# Patient Record
Sex: Female | Born: 1950 | Race: Black or African American | Hispanic: No | Smoking: Never smoker
Health system: Southern US, Community
[De-identification: ages and names within clinical notes are randomized; demographics above are authoritative.]

## PROBLEM LIST (undated history)

## (undated) DIAGNOSIS — G459 Transient cerebral ischemic attack, unspecified: Secondary | ICD-10-CM

## (undated) DIAGNOSIS — I10 Essential (primary) hypertension: Secondary | ICD-10-CM

---

## 2016-01-24 ENCOUNTER — Other Ambulatory Visit: Payer: Self-pay

## 2016-01-24 ENCOUNTER — Encounter: Payer: Self-pay | Admitting: Emergency Medicine

## 2016-01-24 ENCOUNTER — Emergency Department: Payer: Medicare Other

## 2016-01-24 DIAGNOSIS — R0789 Other chest pain: Secondary | ICD-10-CM | POA: Insufficient documentation

## 2016-01-24 DIAGNOSIS — I10 Essential (primary) hypertension: Secondary | ICD-10-CM | POA: Insufficient documentation

## 2016-01-24 LAB — BASIC METABOLIC PANEL
ANION GAP: 7 (ref 5–15)
BUN: 16 mg/dL (ref 6–20)
CHLORIDE: 103 mmol/L (ref 101–111)
CO2: 27 mmol/L (ref 22–32)
Calcium: 9.5 mg/dL (ref 8.9–10.3)
Creatinine, Ser: 0.8 mg/dL (ref 0.44–1.00)
GFR calc non Af Amer: >60 mL/min (ref >60)
Glucose, Bld: 96 mg/dL (ref 65–99)
Potassium: 3.6 mmol/L (ref 3.5–5.1)
Sodium: 137 mmol/L (ref 135–145)

## 2016-01-24 LAB — CBC
HEMATOCRIT: 36.9 % (ref 35.0–47.0)
Hemoglobin: 12.8 g/dL (ref 12.0–16.0)
MCH: 30.2 pg (ref 26.0–34.0)
MCHC: 34.8 g/dL (ref 32.0–36.0)
MCV: 86.7 fL (ref 80.0–100.0)
Platelets: 234 10*3/uL (ref 150–440)
RBC: 4.25 MIL/uL (ref 3.80–5.20)
RDW: 15 % — AB (ref 11.5–14.5)
WBC: 6.4 10*3/uL (ref 3.6–11.0)

## 2016-01-24 LAB — TROPONIN I: Troponin I: <0.03 ng/mL (ref <0.03)

## 2016-01-24 NOTE — ED Notes (Signed)
Patient ambulatory to triage with steady gait, without difficulty or distress noted; pt reports mid CP, nonradiating with no radiating symptoms; denies hx of same although st had similar occurrence 3 days ago

## 2016-01-25 ENCOUNTER — Emergency Department
Admission: EM | Admit: 2016-01-25 | Discharge: 2016-01-25 | Disposition: A | Discharge status: Home / Self Care | Payer: Medicare Other | Attending: Emergency Medicine | Admitting: Emergency Medicine

## 2016-01-25 DIAGNOSIS — R079 Chest pain, unspecified: Secondary | ICD-10-CM

## 2016-01-25 HISTORY — DX: Transient cerebral ischemic attack, unspecified: G45.9

## 2016-01-25 HISTORY — DX: Essential (primary) hypertension: I10

## 2016-01-25 LAB — FIBRIN DERIVATIVES D-DIMER (ARMC ONLY): FIBRIN DERIVATIVES D-DIMER (ARMC): 343 (ref 0–499)

## 2016-01-25 LAB — TROPONIN I: Troponin I: <0.03 ng/mL (ref <0.03)

## 2016-01-25 NOTE — ED Provider Notes (Signed)
[facility] Emergency Department Provider Note  ____________________________________________  Time seen: 1:15 AM  I have reviewed the triage vital signs and the nursing notes.   HISTORY  Chief Complaint Chest Pain      HPI Stacey Mcmillan is a 65 y.o. female presents with nonradiating central chest pain which occurred approximately 9 PM lasting "seconds" that is described as tightness followed by spontaneous resolution. Patient denies any accompanying symptoms no dyspnea no dizziness no diaphoresis no nausea or vomiting. Patient states that she had a similar episode 3 days ago again with the pain lasting "seconds".     Past Medical History  Diagnosis Date  . TIA (transient ischemic attack)   . Hypertension     There are no active problems to display for this patient.   Past surgical history None No current outpatient prescriptions on file.  Allergies No known drug allergies No family history on file.  Social History Social History  Substance Use Topics  . Smoking status: Never Smoker   . Smokeless tobacco: None  . Alcohol Use: No    Review of Systems  Constitutional: Negative for fever. Eyes: Negative for visual changes. ENT: Negative for sore throat. Cardiovascular: Positive for chest pain. Respiratory: Negative for shortness of breath. Gastrointestinal: Negative for abdominal pain, vomiting and diarrhea. Genitourinary: Negative for dysuria. Musculoskeletal: Negative for back pain. Skin: Negative for rash. Neurological: Negative for headaches, focal weakness or numbness.   10-point ROS otherwise negative.  ____________________________________________   PHYSICAL EXAM:  VITAL SIGNS: ED Triage Vitals  Enc Vitals Group     BP 01/24/16 2119 139/83 mmHg     Pulse Rate 01/24/16 2119 84     Resp 01/24/16 2119 18     Temp 01/24/16 2119 98.7 F (37.1 C)     Temp Source 01/24/16 2119 Oral     SpO2 01/24/16 2119 99 %   Weight 01/24/16 2119 150 lb (68.04 kg)     Height 01/24/16 2119  (1.626 m)     Head Cir --      Peak Flow --      Pain Score 01/24/16 2117 3     Pain Loc --      Pain Edu? --      Excl. in GC? --      Constitutional: Alert and oriented. Well appearing and in no distress. Eyes: Conjunctivae are normal. PERRL. Normal extraocular movements. ENT   Head: Normocephalic and atraumatic.   Nose: No congestion/rhinnorhea.   Mouth/Throat: Mucous membranes are moist.   Neck: No stridor. Hematological/Lymphatic/Immunilogical: No cervical lymphadenopathy. Cardiovascular: Normal rate, regular rhythm. Normal and symmetric distal pulses are present in all extremities. No murmurs, rubs, or gallops. Respiratory: Normal respiratory effort without tachypnea nor retractions. Breath sounds are clear and equal bilaterally. No wheezes/rales/rhonchi. Gastrointestinal: Soft and nontender. No distention. There is no CVA tenderness. Genitourinary: deferred Musculoskeletal: Nontender with normal range of motion in all extremities. No joint effusions.  No lower extremity tenderness nor edema. Neurologic:  Normal speech and language. No gross focal neurologic deficits are appreciated. Speech is normal.  Skin:  Skin is warm, dry and intact. No rash noted. Psychiatric: Mood and affect are normal. Speech and behavior are normal. Patient exhibits appropriate insight and judgment.  ____________________________________________    LABS (pertinent positives/negatives)  Labs Reviewed  CBC - Abnormal; Notable for the following:    RDW 15.0 (*)    All other components within normal limits  BASIC METABOLIC PANEL  TROPONIN I  TROPONIN I  FIBRIN DERIVATIVES D-DIMER ([facility] ONLY)     ____________________________________________   EKG  ED ECG REPORT I, [place] N BROWN, the attending physician, personally viewed and interpreted this ECG.   Date: 01/25/2016  EKG Time: 9:13 PM  Rate: 84  Rhythm:  Normal Sinus rhythm  Axis: Normal  Intervals: Normal  ST&T Change: None   ____________________________________________    RADIOLOGY  DG Chest 2 View (Final result) Result time: 01/24/16 21:47:19   Final result by Rad Results In Interface (01/24/16 21:47:19)   Narrative:   CLINICAL DATA: Chest pain  EXAM: CHEST 2 VIEW  COMPARISON: None.  FINDINGS: Normal heart size and mediastinal contours. No acute infiltrate or edema. No effusion or pneumothorax. No acute osseous findings.  IMPRESSION: No active cardiopulmonary disease.   Electronically Signed By: Marnee SpringJonathon Watts M.D. On: 01/24/2016 21:47        Procedures    INITIAL IMPRESSION / ASSESSMENT AND PLAN / ED COURSE  Pertinent labs & imaging results that were available during my care of the patient were reviewed by me and considered in my medical decision making (see chart for details).  No clear etiology of patient's chest pain. Consider possible cardiac etiology however troponin negative 2 EKG unremarkable refer the patient to Dr. Darrold JunkerParaschos cardiologist for outpatient evaluation and management  ____________________________________________   FINAL CLINICAL IMPRESSION(S) / ED DIAGNOSES  Final diagnoses:  Chest pain, unspecified chest pain type      Darci Currentandolph N Brown, MD 01/25/16 [id number]

## 2016-01-25 NOTE — Discharge Instructions (Signed)
Nonspecific Chest Pain  °Chest pain can be caused by many different conditions. There is always a chance that your pain could be related to something serious, such as a heart attack or a blood clot in your lungs. Chest pain can also be caused by conditions that are not life-threatening. If you have chest pain, it is very important to follow up with your health care provider. °CAUSES  °Chest pain can be caused by: °· Heartburn. °· Pneumonia or bronchitis. °· Anxiety or stress. °· Inflammation around your heart (pericarditis) or lung (pleuritis or pleurisy). °· A blood clot in your lung. °· A collapsed lung (pneumothorax). It can develop suddenly on its own (spontaneous pneumothorax) or from trauma to the chest. °· Shingles infection (varicella-zoster virus). °· Heart attack. °· Damage to the bones, muscles, and cartilage that make up your chest wall. This can include: °¨ Bruised bones due to injury. °¨ Strained muscles or cartilage due to frequent or repeated coughing or overwork. °¨ Fracture to one or more ribs. °¨ Sore cartilage due to inflammation (costochondritis). °RISK FACTORS  °Risk factors for chest pain may include: °· Activities that increase your risk for trauma or injury to your chest. °· Respiratory infections or conditions that cause frequent coughing. °· Medical conditions or overeating that can cause heartburn. °· Heart disease or family history of heart disease. °· Conditions or health behaviors that increase your risk of developing a blood clot. °· Having had chicken pox (varicella zoster). °SIGNS AND SYMPTOMS °Chest pain can feel like: °· Burning or tingling on the surface of your chest or deep in your chest. °· Crushing, pressure, aching, or squeezing pain. °· Dull or sharp pain that is worse when you move, cough, or take a deep breath. °· Pain that is also felt in your back, neck, shoulder, or arm, or pain that spreads to any of these areas. °Your chest pain may come and go, or it may stay  constant. °DIAGNOSIS °Lab tests or other studies may be needed to find the cause of your pain. Your health care provider may have you take a test called an ambulatory ECG (electrocardiogram). An ECG records your heartbeat patterns at the time the test is performed. You may also have other tests, such as: °· Transthoracic echocardiogram (TTE). During echocardiography, sound waves are used to create a picture of all of the heart structures and to look at how blood flows through your heart. °· Transesophageal echocardiogram (TEE). This is a more advanced imaging test that obtains images from inside your body. It allows your health care provider to see your heart in finer detail. °· Cardiac monitoring. This allows your health care provider to monitor your heart rate and rhythm in real time. °· Holter monitor. This is a portable device that records your heartbeat and can help to diagnose abnormal heartbeats. It allows your health care provider to track your heart activity for several days, if needed. °· Stress tests. These can be done through exercise or by taking medicine that makes your heart beat more quickly. °· Blood tests. °· Imaging tests. °TREATMENT  °Your treatment depends on what is causing your chest pain. Treatment may include: °· Medicines. These may include: °¨ Acid blockers for heartburn. °¨ Anti-inflammatory medicine. °¨ Pain medicine for inflammatory conditions. °¨ Antibiotic medicine, if an infection is present. °¨ Medicines to dissolve blood clots. °¨ Medicines to treat coronary artery disease. °· Supportive care for conditions that do not require medicines. This may include: °¨ Resting. °¨ Applying heat   or cold packs to injured areas. °¨ Limiting activities until pain decreases. °HOME CARE INSTRUCTIONS °· If you were prescribed an antibiotic medicine, finish it all even if you start to feel better. °· Avoid any activities that bring on chest pain. °· Do not use any tobacco products, including  cigarettes, chewing tobacco, or electronic cigarettes. If you need help quitting, ask your health care provider. °· Do not drink alcohol. °· Take medicines only as directed by your health care provider. °· Keep all follow-up visits as directed by your health care provider. This is important. This includes any further testing if your chest pain does not go away. °· If heartburn is the cause for your chest pain, you may be told to keep your head raised (elevated) while sleeping. This reduces the chance that acid will go from your stomach into your esophagus. °· Make lifestyle changes as directed by your health care provider. These may include: °¨ Getting regular exercise. Ask your health care provider to suggest some activities that are safe for you. °¨ Eating a heart-healthy diet. A registered dietitian can help you to learn healthy eating options. °¨ Maintaining a healthy weight. °¨ Managing diabetes, if necessary. °¨ Reducing stress. °SEEK MEDICAL CARE IF: °· Your chest pain does not go away after treatment. °· You have a rash with blisters on your chest. °· You have a fever. °SEEK IMMEDIATE MEDICAL CARE IF:  °· Your chest pain is worse. °· You have an increasing cough, or you cough up blood. °· You have severe abdominal pain. °· You have severe weakness. °· You faint. °· You have chills. °· You have sudden, unexplained chest discomfort. °· You have sudden, unexplained discomfort in your arms, back, neck, or jaw. °· You have shortness of breath at any time. °· You suddenly start to sweat, or your skin gets clammy. °· You feel nauseous or you vomit. °· You suddenly feel light-headed or dizzy. °· Your heart begins to beat quickly, or it feels like it is skipping beats. °These symptoms may represent a serious problem that is an emergency. Do not wait to see if the symptoms will go away. Get medical help right away. Call your local emergency services (911 in the [place] Do not drive yourself to the hospital. °  °This  information is not intended to replace advice given to you by your health care provider. Make sure you discuss any questions you have with your health care provider. °  °Document Released: 04/02/2005 Document Revised: 07/14/2014 Document Reviewed: 01/27/2014 °Elsevier Interactive Patient Education ©2016 [employer] ° °

## 2016-01-25 NOTE — ED Notes (Signed)
MD Brown at bedside.

## 2016-01-25 NOTE — ED Notes (Signed)
Pt reports she had an episode of central chest pan at 2130 on 7/20 that lasted less than a minute. Pt described the pain as pressure. Pt denies current pain. Pt denies of SOB, weakness, lightheadedness/dizziness, radiation, nausea/vomiting.

## 2016-01-25 NOTE — ED Notes (Signed)
Reviewed d/c instructions, follow-up care with pt. Pt verbalized understanding 

## 2016-07-22 ENCOUNTER — Other Ambulatory Visit: Payer: Self-pay | Admitting: Obstetrics and Gynecology

## 2016-07-22 DIAGNOSIS — Z1231 Encounter for screening mammogram for malignant neoplasm of breast: Secondary | ICD-10-CM

## 2016-08-11 ENCOUNTER — Ambulatory Visit
Admission: EM | Admit: 2016-08-11 | Discharge: 2016-08-11 | Disposition: A | Discharge status: Home / Self Care | Payer: Medicare Other | Attending: Family Medicine | Admitting: Family Medicine

## 2016-08-11 DIAGNOSIS — F418 Other specified anxiety disorders: Secondary | ICD-10-CM | POA: Diagnosis not present

## 2016-08-11 DIAGNOSIS — G44209 Tension-type headache, unspecified, not intractable: Secondary | ICD-10-CM | POA: Diagnosis not present

## 2016-08-11 MED ORDER — HYDROXYZINE HCL 25 MG PO TABS: 25.0000 mg | ORAL_TABLET | Freq: Three times a day (TID) | ORAL | 0 refills | Status: DC | PRN

## 2016-08-11 NOTE — ED Provider Notes (Signed)
[facility]    CSN: [id number] Arrival date & time: 08/11/16  1309     History   Chief Complaint Chief Complaint  Patient presents with  . Headache    HPI Stacey Mcmillan is a 66 y.o. female.   66 yo female with a c/o chronic, intermittent headaches since last July. Denies any injuries, trauma, fevers, chills, vision changes,  States is experiencing a lot of stress and doesn't get good sleep.     Headache  Pain location:  Generalized Quality:  Dull   Past Medical History:  Diagnosis Date  . Hypertension   . TIA (transient ischemic attack)     There are no active problems to display for this patient.   History reviewed. No pertinent surgical history.  OB History    No data available       Home Medications    Prior to Admission medications   Medication Sig Start Date End Date Taking? Authorizing Provider  amLODipine (NORVASC) 5 MG tablet Take 5 mg by mouth daily.   Yes Historical Provider, MD  aspirin 81 MG chewable tablet Chew by mouth daily.   Yes Historical Provider, MD  hydrOXYzine (ATARAX/VISTARIL) 25 MG tablet Take 1 tablet (25 mg total) by mouth every 8 (eight) hours as needed. 08/11/16   Payton Mccallum, MD    Family History History reviewed. No pertinent family history.  Social History Social History  Substance Use Topics  . Smoking status: Never Smoker  . Smokeless tobacco: Never Used  . Alcohol use No     Allergies   Patient has no known allergies.   Review of Systems Review of Systems  Neurological: Positive for headaches.     Physical Exam Triage Vital Signs ED Triage Vitals  Enc Vitals Group     BP 08/11/16 1336 135/85     Pulse Rate 08/11/16 1336 82     Resp 08/11/16 1336 18     Temp 08/11/16 1336 98.2 F (36.8 C)     Temp Source 08/11/16 1336 Oral     SpO2 08/11/16 1336 100 %     Weight 08/11/16 1336 165 lb (74.8 kg)     Height 08/11/16 1336 5\' 4"  (1.626 m)     Head Circumference --      Peak Flow --      Pain Score 08/11/16 1337 7     Pain Loc --      Pain Edu? --      Excl. in GC? --    No data found.   Updated Vital Signs BP 135/85 (BP Location: Left Arm)   Pulse 82   Temp 98.2 F (36.8 C) (Oral)   Resp 18   Ht 5\' 4"  (1.626 m)   Wt 165 lb (74.8 kg)   SpO2 100%   BMI 28.32 kg/m   Visual Acuity Right Eye Distance:   Left Eye Distance:   Bilateral Distance:    Right Eye Near:   Left Eye Near:    Bilateral Near:     Physical Exam  Constitutional: She is oriented to person, place, and time. She appears well-developed and well-nourished. No distress.  HENT:  Head: Normocephalic and atraumatic.  Right Ear: Tympanic membrane, external ear and ear canal normal.  Left Ear: Tympanic membrane, external ear and ear canal normal.  Nose: No mucosal edema, rhinorrhea, nose lacerations, sinus tenderness, nasal deformity, septal deviation or nasal septal hematoma. No epistaxis.  No foreign bodies. Right sinus exhibits no maxillary  sinus tenderness and no frontal sinus tenderness. Left sinus exhibits no maxillary sinus tenderness and no frontal sinus tenderness.  Mouth/Throat: Uvula is midline, oropharynx is clear and moist and mucous membranes are normal. No oropharyngeal exudate.  Eyes: Conjunctivae and EOM are normal. Pupils are equal, round, and reactive to light. Right eye exhibits no discharge. Left eye exhibits no discharge. No scleral icterus.  Neck: Normal range of motion. Neck supple. No thyromegaly present.  Cardiovascular: Normal rate, regular rhythm and normal heart sounds.   Pulmonary/Chest: Effort normal and breath sounds normal. No respiratory distress. She has no wheezes. She has no rales.  Lymphadenopathy:    She has no cervical adenopathy.  Neurological: She is alert and oriented to person, place, and time. She displays normal reflexes. No cranial nerve deficit or sensory deficit. She exhibits normal muscle tone. Coordination normal.  Skin: She is not diaphoretic.    Nursing note and vitals reviewed.    UC Treatments / Results  Labs (all labs ordered are listed, but only abnormal results are displayed) Labs Reviewed - No data to display  EKG  EKG Interpretation None       Radiology No results found.  Procedures Procedures (including critical care time)  Medications Ordered in UC Medications - No data to display   Initial Impression / Assessment and Plan / UC Course  I have reviewed the triage vital signs and the nursing notes.  Pertinent labs & imaging results that were available during my care of the patient were reviewed by me and considered in my medical decision making (see chart for details).       Final Clinical Impressions(s) / UC Diagnoses   Final diagnoses:  Tension headache  Situational anxiety    New Prescriptions Discharge Medication List as of 08/11/2016  2:48 PM    START taking these medications   Details  hydrOXYzine (ATARAX/VISTARIL) 25 MG tablet Take 1 tablet (25 mg total) by mouth every 8 (eight) hours as needed., Starting Mon 08/11/2016, Normal       1. diagnosis reviewed with patient 2. rx as per orders above; reviewed possible side effects, interactions, risks and benefits  3. Recommend supportive treatment with otc analgesics prn 4. Follow-up prn if symptoms worsen or don't improve   Payton Mccallumrlando Loui [place], MD 08/11/16 1531

## 2016-08-11 NOTE — ED Triage Notes (Signed)
Pt c/o headache that comes and goes. She had blood work in July and everything looked ok however she is just scared something is wrong. Denies vision changes, does have increased urination. Feels like she is breaking out with a rash on her face.

## 2016-08-25 ENCOUNTER — Ambulatory Visit: Payer: Medicare Other | Attending: Obstetrics and Gynecology

## 2016-12-11 ENCOUNTER — Inpatient Hospital Stay: Admission: RE | Admit: 2016-12-11 | Payer: Medicare Other | Source: Ambulatory Visit

## 2017-02-10 ENCOUNTER — Encounter: Payer: Self-pay | Admitting: Urology

## 2017-02-10 ENCOUNTER — Ambulatory Visit: Payer: Self-pay | Admitting: Urology

## 2017-04-02 ENCOUNTER — Other Ambulatory Visit: Payer: Self-pay | Admitting: Nurse Practitioner

## 2017-04-02 DIAGNOSIS — G44099 Other trigeminal autonomic cephalgias (TAC), not intractable: Secondary | ICD-10-CM

## 2017-04-08 ENCOUNTER — Ambulatory Visit: Payer: Medicare Other

## 2017-04-24 ENCOUNTER — Ambulatory Visit: Admission: RE | Admit: 2017-04-24 | Payer: Medicare Other | Source: Ambulatory Visit

## 2017-05-22 ENCOUNTER — Ambulatory Visit: Admission: RE | Admit: 2017-05-22 | Payer: Medicare Other | Source: Ambulatory Visit

## 2017-08-11 ENCOUNTER — Other Ambulatory Visit: Payer: Self-pay | Admitting: Nurse Practitioner

## 2017-08-11 DIAGNOSIS — G44099 Other trigeminal autonomic cephalgias (TAC), not intractable: Secondary | ICD-10-CM

## 2017-08-12 ENCOUNTER — Other Ambulatory Visit: Payer: Self-pay | Admitting: Nurse Practitioner

## 2017-08-12 DIAGNOSIS — G44099 Other trigeminal autonomic cephalgias (TAC), not intractable: Secondary | ICD-10-CM

## 2017-08-13 ENCOUNTER — Ambulatory Visit: Payer: Medicare Other

## 2017-08-13 ENCOUNTER — Ambulatory Visit: Admission: RE | Admit: 2017-08-13 | Payer: Medicare Other | Source: Ambulatory Visit

## 2017-08-20 ENCOUNTER — Ambulatory Visit: Admission: RE | Admit: 2017-08-20 | Payer: Medicare Other | Source: Ambulatory Visit

## 2017-09-19 ENCOUNTER — Ambulatory Visit: Admission: RE | Admit: 2017-09-19 | Payer: Medicare Other | Source: Ambulatory Visit

## 2017-12-17 ENCOUNTER — Other Ambulatory Visit: Payer: Self-pay | Admitting: Nurse Practitioner

## 2017-12-17 DIAGNOSIS — G44099 Other trigeminal autonomic cephalgias (TAC), not intractable: Secondary | ICD-10-CM

## 2017-12-23 ENCOUNTER — Ambulatory Visit
Admission: RE | Admit: 2017-12-23 | Discharge: 2017-12-23 | Disposition: A | Discharge status: Home / Self Care | Payer: Medicare Other | Source: Ambulatory Visit | Attending: Nurse Practitioner | Admitting: Nurse Practitioner

## 2017-12-23 DIAGNOSIS — G44099 Other trigeminal autonomic cephalgias (TAC), not intractable: Secondary | ICD-10-CM | POA: Diagnosis present

## 2017-12-23 DIAGNOSIS — R51 Headache: Secondary | ICD-10-CM | POA: Insufficient documentation

## 2018-12-14 ENCOUNTER — Other Ambulatory Visit: Payer: Self-pay

## 2018-12-14 ENCOUNTER — Encounter: Payer: Self-pay | Admitting: Emergency Medicine

## 2018-12-14 ENCOUNTER — Emergency Department
Admission: EM | Admit: 2018-12-14 | Discharge: 2018-12-14 | Disposition: A | Discharge status: Home / Self Care | Payer: Medicare Other | Attending: Emergency Medicine | Admitting: Emergency Medicine

## 2018-12-14 DIAGNOSIS — R531 Weakness: Secondary | ICD-10-CM

## 2018-12-14 DIAGNOSIS — Z7982 Long term (current) use of aspirin: Secondary | ICD-10-CM | POA: Insufficient documentation

## 2018-12-14 DIAGNOSIS — Z79899 Other long term (current) drug therapy: Secondary | ICD-10-CM | POA: Insufficient documentation

## 2018-12-14 DIAGNOSIS — R112 Nausea with vomiting, unspecified: Secondary | ICD-10-CM | POA: Insufficient documentation

## 2018-12-14 DIAGNOSIS — Z8673 Personal history of transient ischemic attack (TIA), and cerebral infarction without residual deficits: Secondary | ICD-10-CM | POA: Insufficient documentation

## 2018-12-14 DIAGNOSIS — I10 Essential (primary) hypertension: Secondary | ICD-10-CM | POA: Diagnosis not present

## 2018-12-14 DIAGNOSIS — E876 Hypokalemia: Secondary | ICD-10-CM | POA: Diagnosis not present

## 2018-12-14 LAB — URINE DRUG SCREEN, QUALITATIVE (ARMC ONLY)
Amphetamines, Ur Screen: NOT DETECTED
Barbiturates, Ur Screen: NOT DETECTED
Benzodiazepine, Ur Scrn: NOT DETECTED
Cannabinoid 50 Ng, Ur ~~LOC~~: NOT DETECTED
Cocaine Metabolite,Ur ~~LOC~~: NOT DETECTED
MDMA (Ecstasy)Ur Screen: NOT DETECTED
Methadone Scn, Ur: NOT DETECTED
Opiate, Ur Screen: NOT DETECTED
Phencyclidine (PCP) Ur S: NOT DETECTED
Tricyclic, Ur Screen: NOT DETECTED

## 2018-12-14 LAB — CBC WITH DIFFERENTIAL/PLATELET
Abs Immature Granulocytes: 0.05 10*3/uL (ref 0.00–0.07)
Basophils Absolute: 0 10*3/uL (ref 0.0–0.1)
Basophils Relative: 1 %
Eosinophils Absolute: 0.2 10*3/uL (ref 0.0–0.5)
Eosinophils Relative: 3 %
HCT: 37.1 % (ref 36.0–46.0)
Hemoglobin: 12.5 g/dL (ref 12.0–15.0)
Immature Granulocytes: 1 %
Lymphocytes Relative: 38 %
Lymphs Abs: 3.2 10*3/uL (ref 0.7–4.0)
MCH: 29.1 pg (ref 26.0–34.0)
MCHC: 33.7 g/dL (ref 30.0–36.0)
MCV: 86.5 fL (ref 80.0–100.0)
Monocytes Absolute: 0.5 10*3/uL (ref 0.1–1.0)
Monocytes Relative: 6 %
Neutro Abs: 4.3 10*3/uL (ref 1.7–7.7)
Neutrophils Relative %: 51 %
Platelets: 287 10*3/uL (ref 150–400)
RBC: 4.29 MIL/uL (ref 3.87–5.11)
RDW: 14.6 % (ref 11.5–15.5)
WBC: 8.3 10*3/uL (ref 4.0–10.5)
nRBC: 0 % (ref 0.0–0.2)

## 2018-12-14 LAB — URINALYSIS, COMPLETE (UACMP) WITH MICROSCOPIC
Bilirubin Urine: NEGATIVE
Glucose, UA: NEGATIVE mg/dL
Hgb urine dipstick: NEGATIVE
Ketones, ur: NEGATIVE mg/dL
Leukocytes,Ua: NEGATIVE
Nitrite: NEGATIVE
Protein, ur: 30 mg/dL — AB
Specific Gravity, Urine: 1.018 (ref 1.005–1.030)
pH: 6 (ref 5.0–8.0)

## 2018-12-14 LAB — BASIC METABOLIC PANEL
Anion gap: 8 (ref 5–15)
BUN: 12 mg/dL (ref 8–23)
CO2: 26 mmol/L (ref 22–32)
Calcium: 9 mg/dL (ref 8.9–10.3)
Chloride: 106 mmol/L (ref 98–111)
Creatinine, Ser: 0.72 mg/dL (ref 0.44–1.00)
GFR calc Af Amer: >60 mL/min (ref >60)
GFR calc non Af Amer: >60 mL/min (ref >60)
Glucose, Bld: 162 mg/dL — ABNORMAL HIGH (ref 70–99)
Potassium: 2.5 mmol/L — CL (ref 3.5–5.1)
Sodium: 140 mmol/L (ref 135–145)

## 2018-12-14 LAB — LIPASE, BLOOD: Lipase: 32 U/L (ref 11–51)

## 2018-12-14 LAB — HEPATIC FUNCTION PANEL
ALT: 16 U/L (ref 0–44)
AST: 20 U/L (ref 15–41)
Albumin: 4 g/dL (ref 3.5–5.0)
Alkaline Phosphatase: 62 U/L (ref 38–126)
Bilirubin, Direct: <0.1 mg/dL (ref 0.0–0.2)
Total Bilirubin: 0.5 mg/dL (ref 0.3–1.2)
Total Protein: 7.9 g/dL (ref 6.5–8.1)

## 2018-12-14 LAB — TROPONIN I: Troponin I: <0.03 ng/mL (ref <0.03)

## 2018-12-14 MED ORDER — ONDANSETRON HCL 4 MG/2ML IJ SOLN
4.0000 mg | Freq: Once | INTRAMUSCULAR | Status: DC
  Filled 2018-12-14: qty 2

## 2018-12-14 MED ORDER — POTASSIUM CHLORIDE ER 20 MEQ PO TBCR: 20.0000 meq | EXTENDED_RELEASE_TABLET | Freq: Two times a day (BID) | ORAL | 0 refills | Status: AC

## 2018-12-14 MED ORDER — SODIUM CHLORIDE 0.9 % IV BOLUS
1000.0000 mL | Freq: Once | INTRAVENOUS | Status: AC
  Administered 2018-12-14: 1000 mL via INTRAVENOUS

## 2018-12-14 MED ORDER — POTASSIUM CHLORIDE 10 MEQ/100ML IV SOLN
10.0000 meq | Freq: Once | INTRAVENOUS | Status: AC
  Administered 2018-12-14: 10 meq via INTRAVENOUS
  Filled 2018-12-14: qty 100

## 2018-12-14 NOTE — ED Triage Notes (Signed)
Pt ems from car at [facility] for Nausea and weakness. Pt states that symptoms started 0700 today. Pt sleepy, slow speech, oriented x 4. Pts cloths are wet stating that she poured water on herself to cool down. cbg 140 for ems

## 2018-12-14 NOTE — Discharge Instructions (Signed)
Take the potassium as prescribed for the next week.  Make an appointment to follow-up with your doctor in 1 to 2 weeks to have the potassium level rechecked and for general follow-up.  Return to the ER for new, worsening, or persistent vomiting, weakness, or any other new or worsening symptoms that concern you.

## 2018-12-14 NOTE — ED Provider Notes (Signed)
[facility] Emergency Department Provider Note ____________________________________________   First MD Initiated Contact with Patient 12/14/18 0801     (approximate)  I have reviewed the triage vital signs and the nursing notes.   HISTORY  Chief Complaint Weakness    HPI Stacey Mcmillan is a 68 y.o. female with PMH as noted below who presents with generalized weakness, acute onset, associated with nausea and vomiting and feeling very hot, and now the patient reports feeling fatigued and tired.  The patient states that she was dropping off her husband at dialysis when she started to feel sick and vomited.  Per EMS, the patient was sleepy and they were concerned because she looked diaphoretic, however it turned out that she had poured water on herself to cool down.  The patient states that she felt fine yesterday and when she awoke this morning.  She denies any abdominal or chest pain, headache, fever, diarrhea, or urinary symptoms.  She has had no new medications.  Past Medical History:  Diagnosis Date  . Hypertension   . TIA (transient ischemic attack)     There are no active problems to display for this patient.   History reviewed. No pertinent surgical history.  Prior to Admission medications   Medication Sig Start Date End Date Taking? Authorizing Provider  amLODipine (NORVASC) 2.5 MG tablet Take 2.5 mg by mouth daily.    Yes [provider]  aspirin 81 MG chewable tablet Chew by mouth daily.   Yes [provider]  Vitamin D, Ergocalciferol, (DRISDOL) 1.25 MG (50000 UT) CAPS capsule Take 50,000 Units by mouth once a week. 12/13/18 01/12/19 Yes [provider]  potassium chloride 20 MEQ TBCR Take 20 mEq by mouth 2 (two) times daily for 7 days. 12/14/18 12/21/18  Dionne BucySiadecki, Keymoni Mccaster, MD    Allergies Penicillins; Shellfish allergy; and Orange oil  History reviewed. No pertinent family history.  Social History Social History    Tobacco Use  . Smoking status: Never Smoker  . Smokeless tobacco: Never Used  Substance Use Topics  . Alcohol use: No  . Drug use: No    Review of Systems  Constitutional: No fever.  Positive for weakness. Eyes: No redness. ENT: No sore throat. Cardiovascular: Denies chest pain. Respiratory: Denies shortness of breath. Gastrointestinal: Positive for nausea and vomiting. Genitourinary: Negative for dysuria.  Musculoskeletal: Negative for back pain. Skin: Negative for rash. Neurological: Negative for headaches, focal weakness or numbness.   ____________________________________________   PHYSICAL EXAM:  VITAL SIGNS: ED Triage Vitals  Enc Vitals Group     BP 12/14/18 0815 130/77     Pulse Rate 12/14/18 0815 71     Resp --      Temp 12/14/18 0815 (!) 97.5 F (36.4 C)     Temp Source 12/14/18 0815 Oral     SpO2 12/14/18 0815 97 %     Weight 12/14/18 0816 165 lb (74.8 kg)     Height 12/14/18 0816 5\' 4"  (1.626 m)     Head Circumference --      Peak Flow --      Pain Score 12/14/18 0816 0     Pain Loc --      Pain Edu? --      Excl. in GC? --     Constitutional: Alert and oriented.  Tired appearing but in no acute distress. Eyes: Conjunctivae are normal.  EOMI.  PERRLA.  No scleral icterus. Head: Atraumatic. Nose: No congestion/rhinnorhea. Mouth/Throat: Mucous membranes  are dry.   Neck: Normal range of motion.  Cardiovascular: Normal rate, regular rhythm. Good peripheral circulation. Respiratory: Normal respiratory effort.  No retractions.  Gastrointestinal: Soft and nontender. No distention.  Genitourinary: No flank tenderness. Musculoskeletal: No lower extremity edema.  Extremities warm and well perfused.  Neurologic:  Normal speech and language.  Motor intact in all extremities.  Normal coordination.  No gross focal neurologic deficits are appreciated.  Skin:  Skin is warm and dry. No rash noted. Psychiatric: Mood and affect are normal. Speech and behavior are  normal.  ____________________________________________   LABS (all labs ordered are listed, but only abnormal results are displayed)  Labs Reviewed  BASIC METABOLIC PANEL - Abnormal; Notable for the following components:      Result Value   Potassium 2.5 (*)    Glucose, Bld 162 (*)    All other components within normal limits  URINALYSIS, COMPLETE (UACMP) WITH MICROSCOPIC - Abnormal; Notable for the following components:   Color, Urine YELLOW (*)    APPearance HAZY (*)    Protein, ur 30 (*)    Bacteria, UA RARE (*)    All other components within normal limits  HEPATIC FUNCTION PANEL  LIPASE, BLOOD  TROPONIN I  CBC WITH DIFFERENTIAL/PLATELET  URINE DRUG SCREEN, QUALITATIVE ([facility] ONLY)   ____________________________________________  EKG  ED ECG REPORT I, Arta Silence, the attending physician, personally viewed and interpreted this ECG.  Date: 12/14/2018 EKG Time: 0810 Rate: 70 Rhythm: normal sinus rhythm QRS Axis: normal Intervals: normal ST/T Wave abnormalities: Nonspecific T wave abnormalities laterally Narrative Interpretation: no evidence of acute ischemia; similar appearance to EKG from 01/24/2016  ____________________________________________  RADIOLOGY    ____________________________________________   PROCEDURES  Procedure(s) performed: No  Procedures  Critical Care performed: No ____________________________________________   INITIAL IMPRESSION / ASSESSMENT AND PLAN / ED COURSE  Pertinent labs & imaging results that were available during my care of the patient were reviewed by me and considered in my medical decision making (see chart for details).  68 year old female with PMH of hypertension and TIA presents with acute onset of nausea and vomiting as well as generalized weakness and malaise this morning after dropping off her husband at dialysis.  The patient states she was in her usual state of health yesterday and this morning.  I reviewed  the past medical records in [place].  The patient has had no recent prior ED visits or admissions, and actually was seen by her PMD Dr. Ginette Pitman for a routine visit yesterday with no acute symptoms.  On exam, the patient is tired but relatively well-appearing.  Her vital signs are normal.  EMS reported that the patient was acting somewhat strange, and although she is in the bed with her eyes closed and states she feels weak, she is fully alert and oriented.  She is answering all questions appropriately and can fully participate in exam.  She denies any acute pain.  The abdomen is soft and nontender.  Neurologic exam is nonfocal.  Overall I suspect most likely gastroenteritis, gastritis, gastroparesis, or viral syndrome, with likely vasovagal near syncope resulting in the more generalized symptoms.  Given the benign abdominal exam there is no evidence of acute intra-abdominal process.  The patient has no neurologic deficits, headache, or other symptoms to suggest CNS etiology.  I have a low suspicion for cardiac cause and there are no EKG changes.  We will give fluids and Zofran, obtain labs, and reassess.  ----------------------------------------- 1:19 PM on 12/14/2018 -----------------------------------------  The  lab work-up is unremarkable except for hypokalemia.  I do not think that this is all acute.  I suspect that the patient had some chronic hypokalemia and then it may have worsened with the vomiting today.  She has no EKG abnormalities.  We repleted with 2 rounds of IV potassium and also gave a liter of normal saline.  The patient is feeling better and appears comfortable.  She has had no vomiting and no further symptoms in the ED.  She would like to go home.  I counseled her on the results of the work-up.  I will prescribe a week's supply of oral potassium supplementation and instructed her to follow-up with her PMD.  Return precautions given, and she expresses understanding.  ____________________________________________   FINAL CLINICAL IMPRESSION(S) / ED DIAGNOSES  Final diagnoses:  Hypokalemia  Generalized weakness  Non-intractable vomiting with nausea, unspecified vomiting type      NEW MEDICATIONS STARTED DURING THIS VISIT:  New Prescriptions   POTASSIUM CHLORIDE 20 MEQ TBCR    Take 20 mEq by mouth 2 (two) times daily for 7 days.     Note:  This document was prepared using Dragon voice recognition software and may include unintentional dictation errors.    Dionne BucySiadecki, Edra Riccardi, MD 12/14/18 1320

## 2019-05-11 ENCOUNTER — Other Ambulatory Visit: Payer: Self-pay | Admitting: Internal Medicine

## 2019-05-11 DIAGNOSIS — Z1231 Encounter for screening mammogram for malignant neoplasm of breast: Secondary | ICD-10-CM

## 2019-12-30 IMAGING — MR MR HEAD W/O CM
11 series · 48 of 48 positions shown · non-contrast
Comparison: None.

CLINICAL DATA: Trigeminal autonomic cephalgias. Intermittent
headaches for 1 year.

EXAM:
MRI HEAD WITHOUT CONTRAST
TECHNIQUE: Multiplanar, multiecho pulse sequences of the brain and surrounding
structures were obtained without intravenous contrast.

[Series 2: T1 · sagittal · 5.0mm · 0.45mm/px · 3 of 27 slices shown (1 of 2)]
[im 1/27]
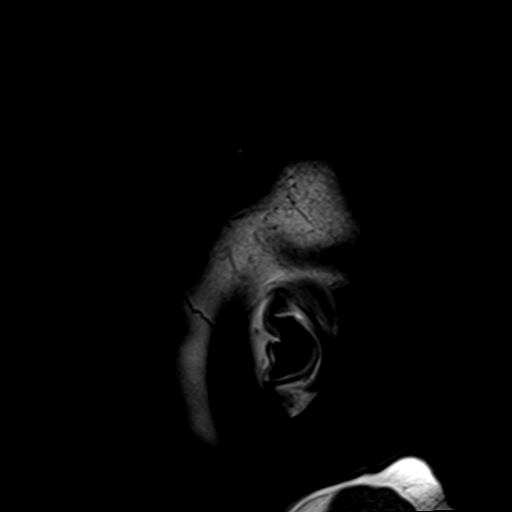
[im 14/27]
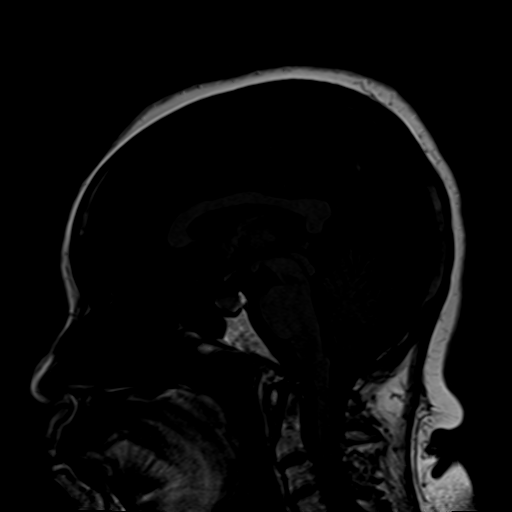
[im 27/27]
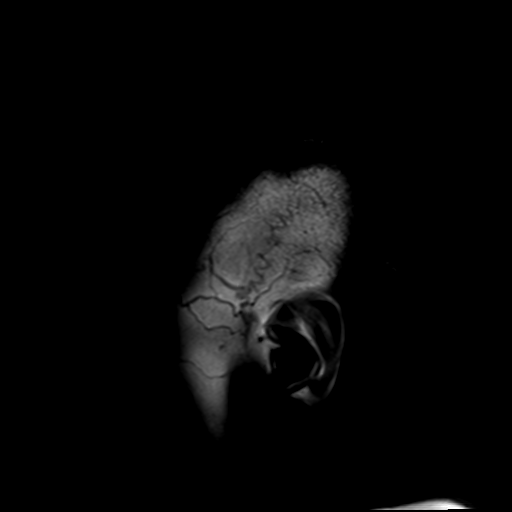

[Series 4: DWI · axial · 3.0mm · 1.80mm/px · z∈[-10,+143]mm · 5 of 52 slices shown (1 of 2)]
[im 1/52]
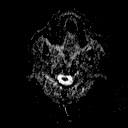
[im 13/52]
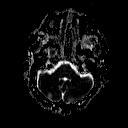
[im 26/52]
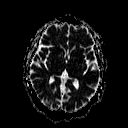
[im 39/52]
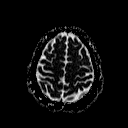
[im 52/52]
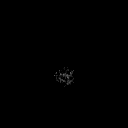

[Series 6: DWI · coronal · 3.0mm · 1.80mm/px · 4 of 47 slices shown (2 of 2)]
[im 1/47]
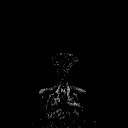
[im 16/47]
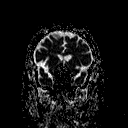
[im 31/47]
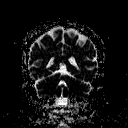
[im 47/47]
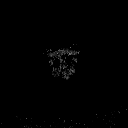

[Series 7: T2 · axial · 5.0mm · 0.60mm/px · z∈[-16,+140]mm · 2 of 25 slices shown (1 of 3)]
[im 1/25]
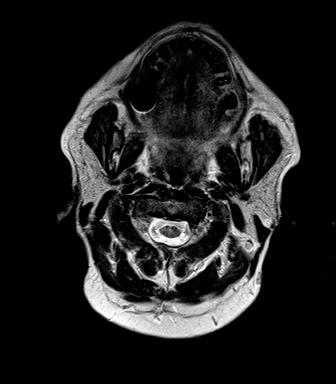
[im 25/25]
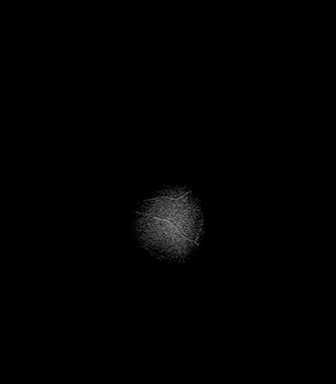

[Series 8: FLAIR · axial · 3.0mm · 0.45mm/px · z∈[-16,+140]mm · 4 of 53 slices shown (1 of 2)]
[im 1/53]
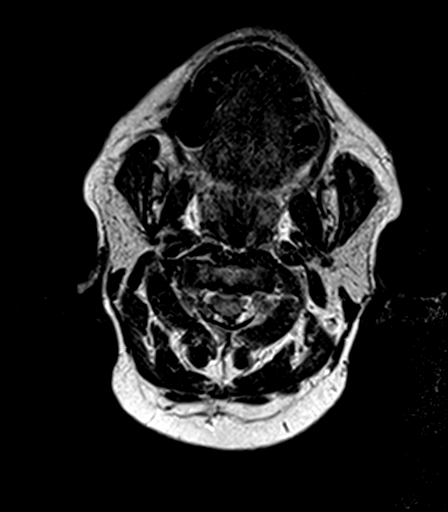
[im 18/53]
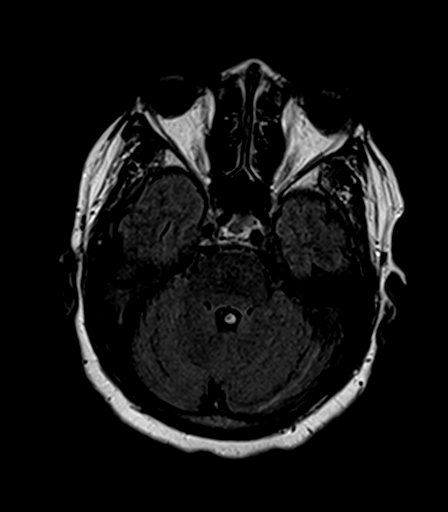
[im 35/53]
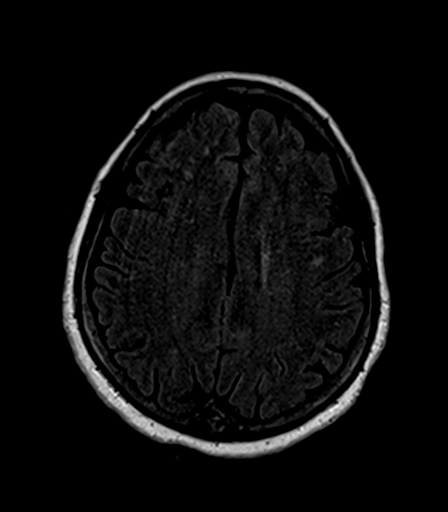
[im 53/53]
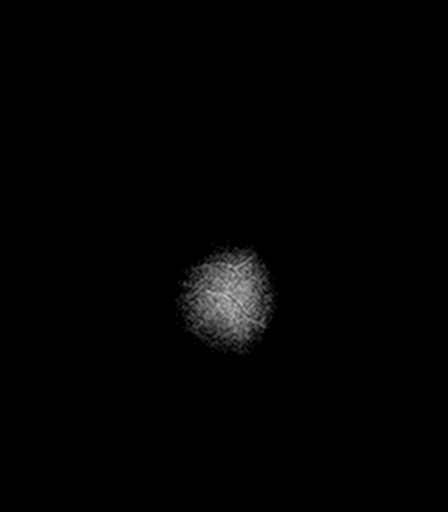

[Series 9: T2 · axial · 5.0mm · 0.45mm/px · z∈[-16,+140]mm · 2 of 25 slices shown (2 of 3)]
[im 1/25]
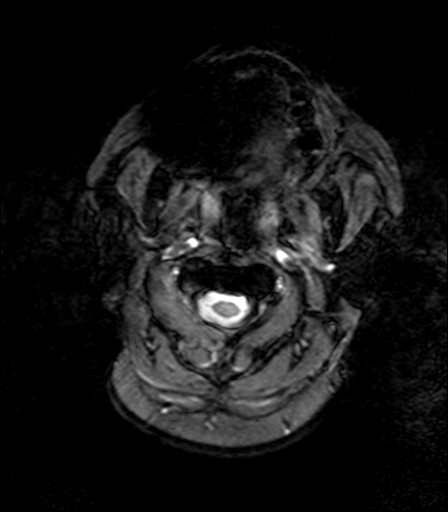
[im 25/25]
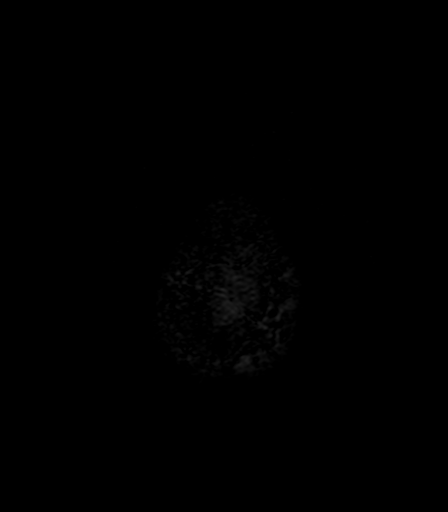

[Series 10: T1 · axial · 1.0mm · 1.00mm/px · z∈[-18,+140]mm · 14 of 160 slices shown (2 of 2)]
[im 1/160]
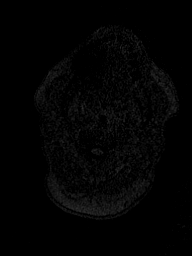
[im 13/160]
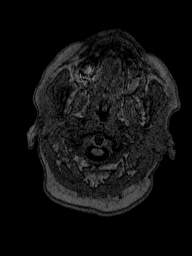
[im 25/160]
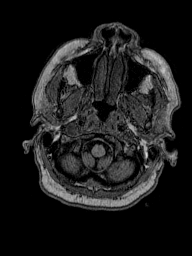
[im 37/160]
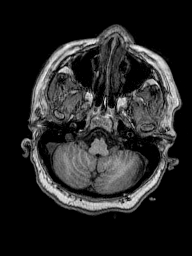
[im 49/160]
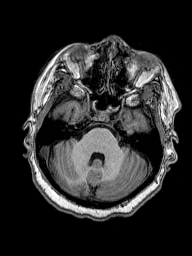
[im 62/160]
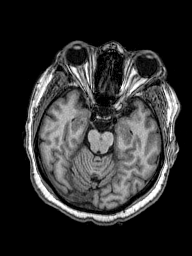
[im 74/160]
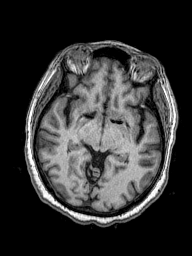
[im 86/160]
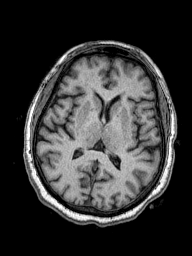
[im 98/160]
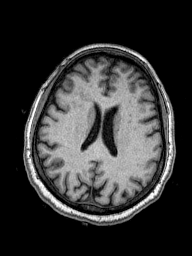
[im 111/160]
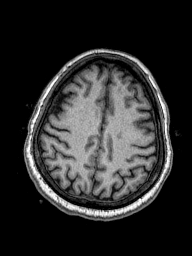
[im 123/160]
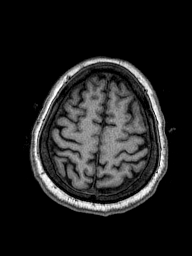
[im 135/160]
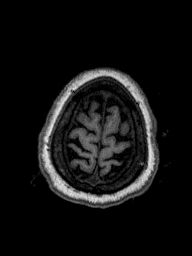
[im 147/160]
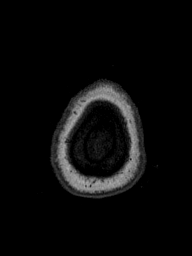
[im 160/160]
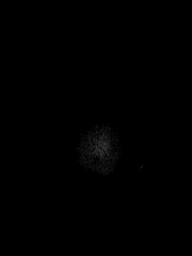

[Series 11: T2 · coronal · 5.0mm · 0.49mm/px · 2 of 28 slices shown (3 of 3)]
[im 1/28]
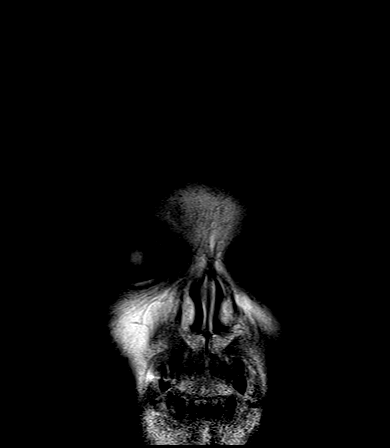
[im 28/28]
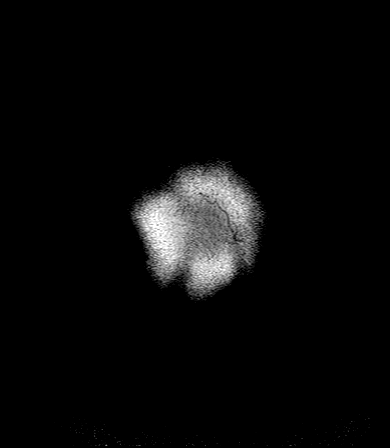

[Series 12: FLAIR · axial · 3.0mm · 0.45mm/px · z∈[-16,+140]mm · 4 of 53 slices shown (2 of 2)]
[im 1/53]
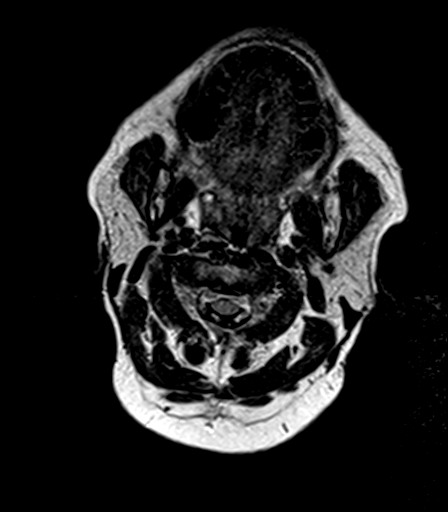
[im 18/53]
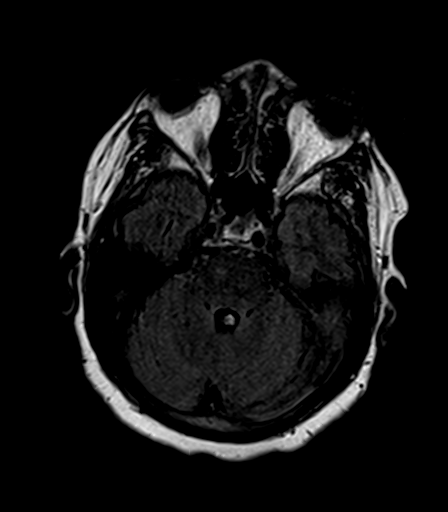
[im 35/53]
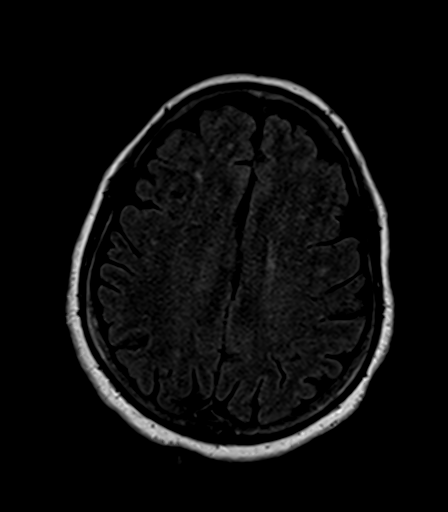
[im 53/53]
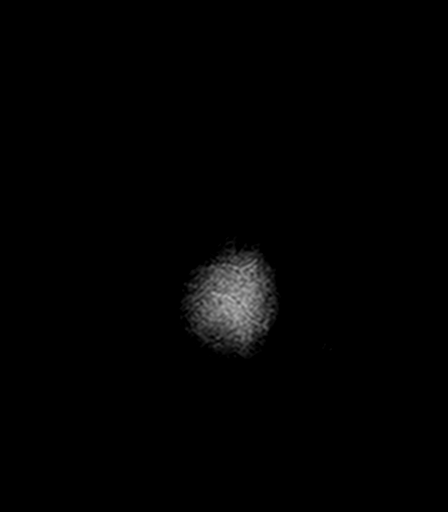

[Series 100: ax (id) · axial · 3.0mm · 1.80mm/px · z∈[-10,+143]mm · 4 of 52 slices shown]
[im 1/52]
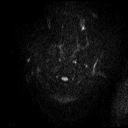
[im 18/52]
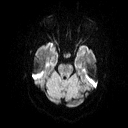
[im 35/52]
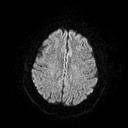
[im 52/52]
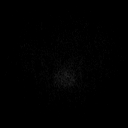

[Series 101: cor (id) · coronal · 3.0mm · 1.80mm/px · 4 of 45 slices shown]
[im 1/45]
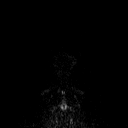
[im 15/45]
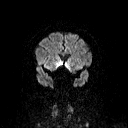
[im 30/45]
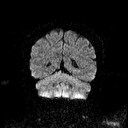
[im 45/45]
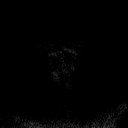

[48 of 48 positions shown; findings below may reference images not displayed]

FINDINGS: Brain: There is no evidence of acute infarct, intracranial
hemorrhage, mass, midline shift, or extra-axial fluid collection.
The ventricles and sulci are normal for age. Small foci of T2
hyperintensity in the subcortical and deep cerebral white matter
predominantly involve the frontal lobes and are nonspecific but
compatible with mild chronic small vessel ischemic disease.

Vascular: Major intracranial vascular flow voids are preserved.

Skull and upper cervical spine: Unremarkable bone marrow signal.

Sinuses/Orbits: Asymmetrically enlarged left globe. Small right
maxillary sinus mucous retention cyst. Clear mastoid air cells.

Other: None.
IMPRESSION: 1. No acute intracranial abnormality or cause of headaches
identified.
2. Mild chronic small vessel ischemic disease.

## 2020-08-28 ENCOUNTER — Other Ambulatory Visit: Payer: Self-pay | Admitting: Internal Medicine

## 2020-08-28 DIAGNOSIS — Z1231 Encounter for screening mammogram for malignant neoplasm of breast: Secondary | ICD-10-CM
